# Patient Record
Sex: Female | Born: 1950 | Race: White | Hispanic: No | Marital: Married
Health system: Southern US, Community
[De-identification: ages and names within clinical notes are randomized; demographics above are authoritative.]

## PROBLEM LIST (undated history)

## (undated) HISTORY — PX: BREAST CYST ASPIRATION: SHX578

---

## 2006-10-16 ENCOUNTER — Ambulatory Visit: Payer: Self-pay | Admitting: Family Medicine

## 2007-04-08 ENCOUNTER — Ambulatory Visit: Payer: Self-pay | Admitting: Family Medicine

## 2007-10-20 ENCOUNTER — Ambulatory Visit: Payer: Self-pay | Admitting: Family Medicine

## 2008-10-20 ENCOUNTER — Ambulatory Visit: Payer: Self-pay | Admitting: Family Medicine

## 2009-05-24 ENCOUNTER — Ambulatory Visit: Payer: Self-pay | Admitting: Family Medicine

## 2009-10-28 ENCOUNTER — Ambulatory Visit: Payer: Self-pay | Admitting: Family Medicine

## 2011-05-04 ENCOUNTER — Ambulatory Visit: Payer: Self-pay | Admitting: Family Medicine

## 2012-10-16 ENCOUNTER — Ambulatory Visit: Payer: Self-pay | Admitting: Family Medicine

## 2013-06-19 ENCOUNTER — Ambulatory Visit: Payer: Self-pay | Admitting: Family Medicine

## 2014-06-17 ENCOUNTER — Ambulatory Visit: Admit: 2014-06-17 | Disposition: A | Discharge status: Home / Self Care | Payer: Self-pay

## 2014-06-22 ENCOUNTER — Ambulatory Visit
Admit: 2014-06-22 | Disposition: A | Discharge status: Home / Self Care | Payer: Self-pay | Attending: Family Medicine | Admitting: Family Medicine

## 2014-06-23 ENCOUNTER — Ambulatory Visit: Admit: 2014-06-23 | Disposition: A | Discharge status: Home / Self Care | Payer: Self-pay

## 2016-06-15 ENCOUNTER — Other Ambulatory Visit: Payer: Self-pay | Admitting: Family Medicine

## 2016-06-15 DIAGNOSIS — Z1231 Encounter for screening mammogram for malignant neoplasm of breast: Secondary | ICD-10-CM

## 2016-07-06 IMAGING — MG MM DIGITAL SCREENING BILAT W/ CAD
3 series · 6 of 6 positions shown · non-contrast
Comparison: Previous exam(s).

CLINICAL DATA: Screening.

EXAM:
DIGITAL SCREENING BILATERAL MAMMOGRAM WITH CAD

[R CC · right · 4 of 4 slices shown (1 of 2)]
[im 1/4]
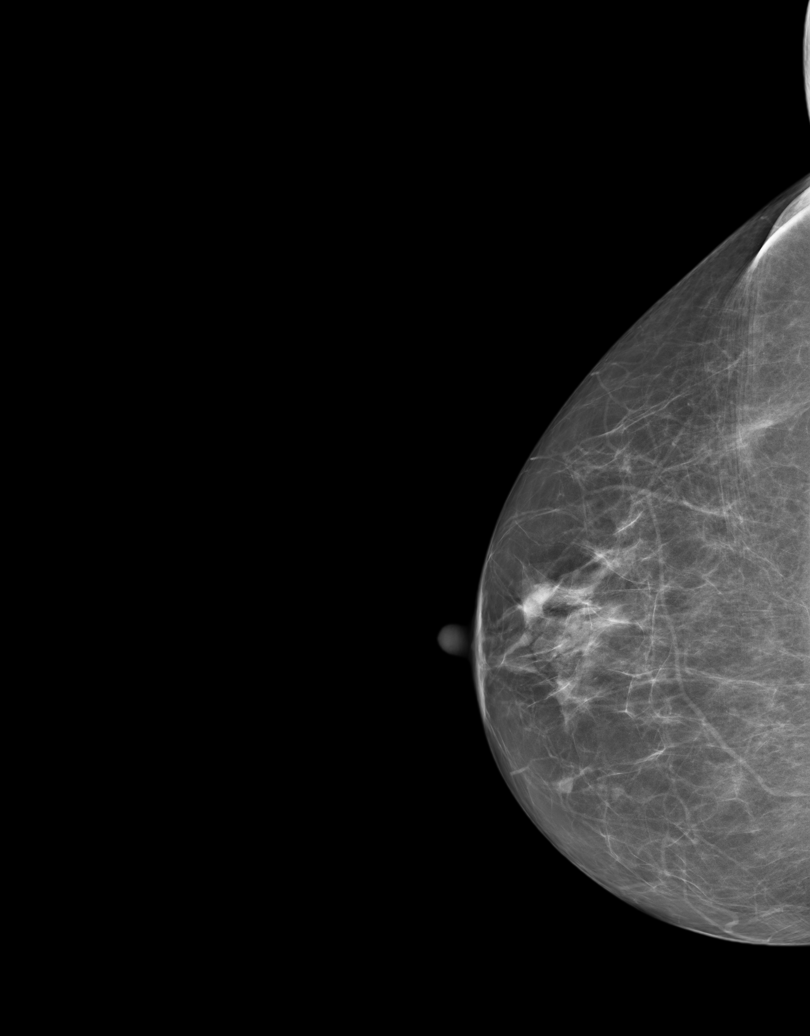
[im 2/4]
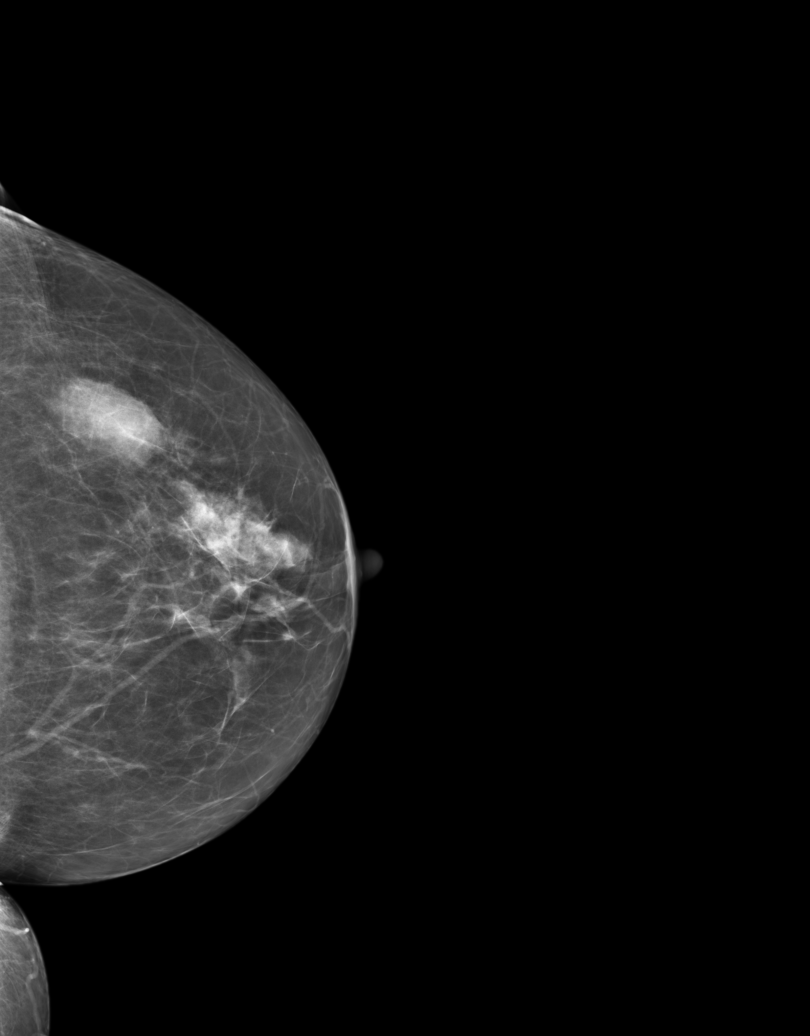
[im 3/4]
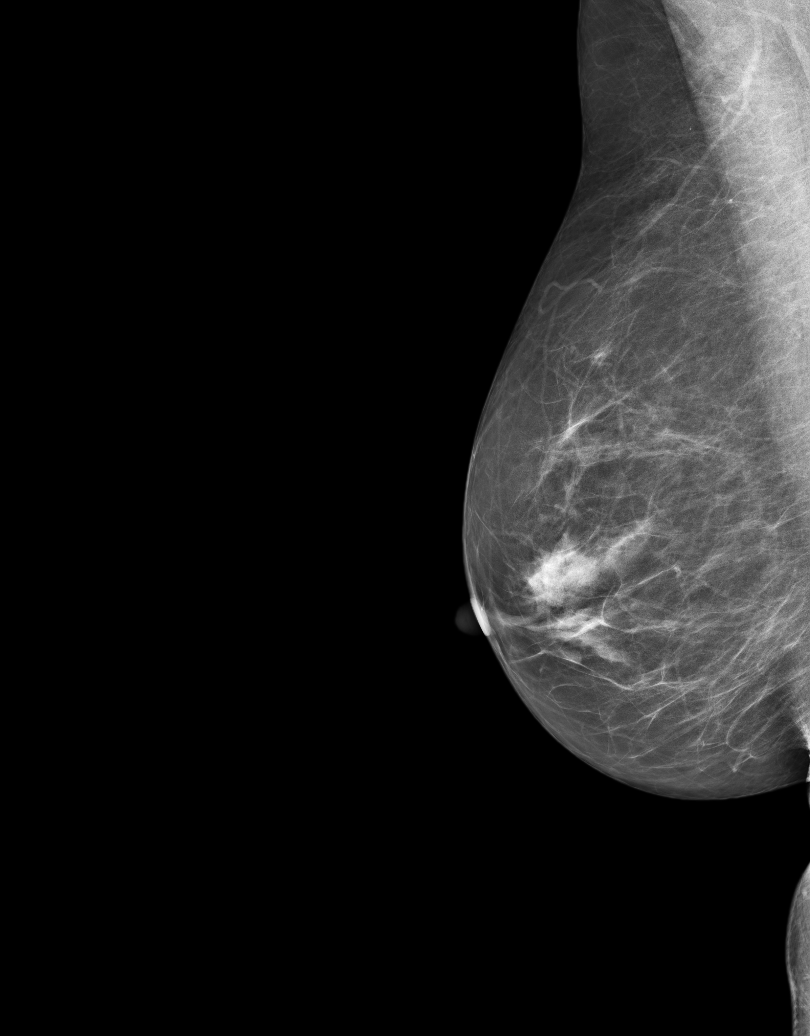
[im 4/4]
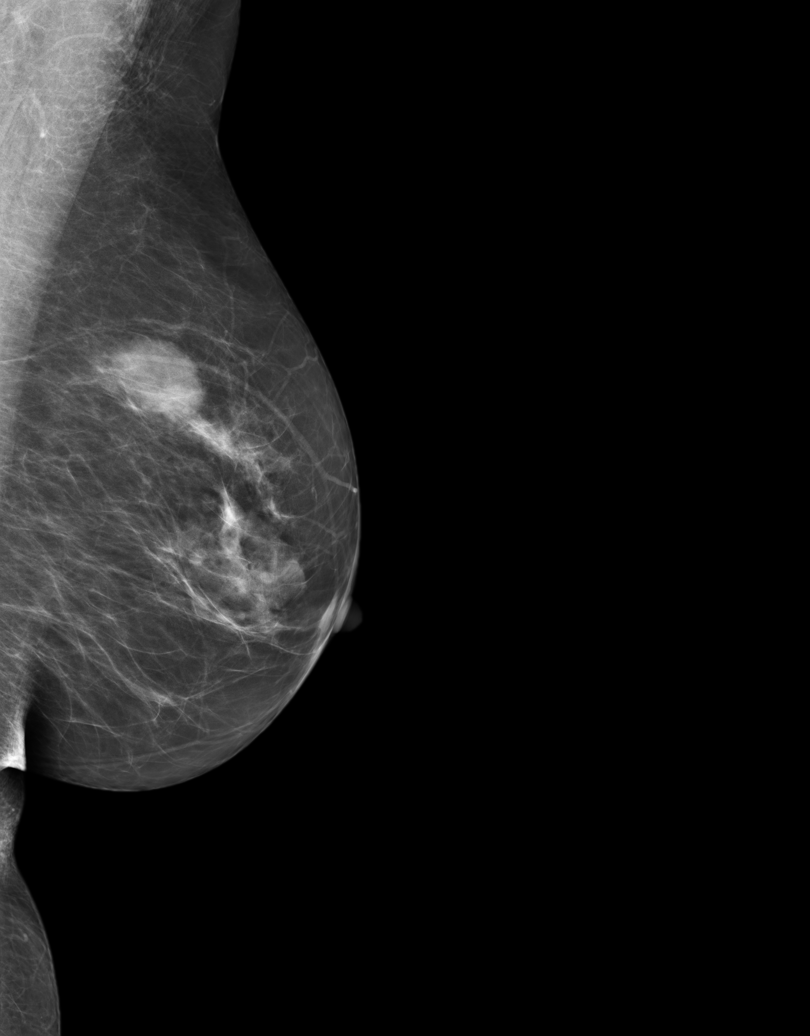

[R CC (2 of 2)]
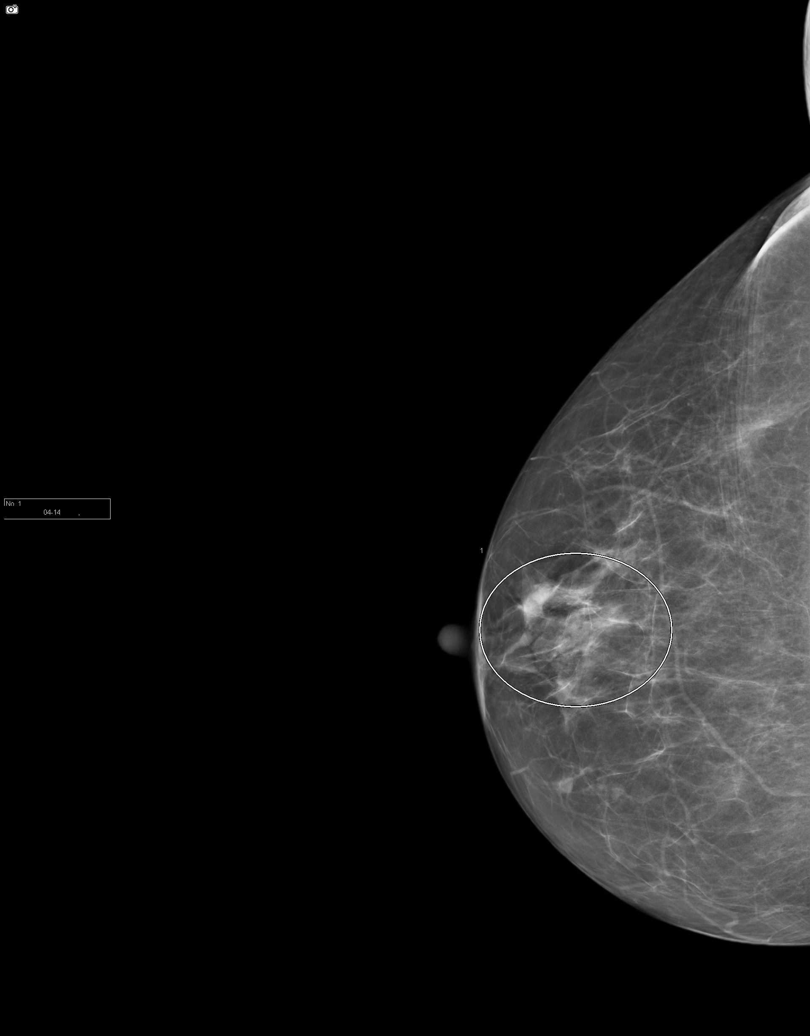

[R MLO]
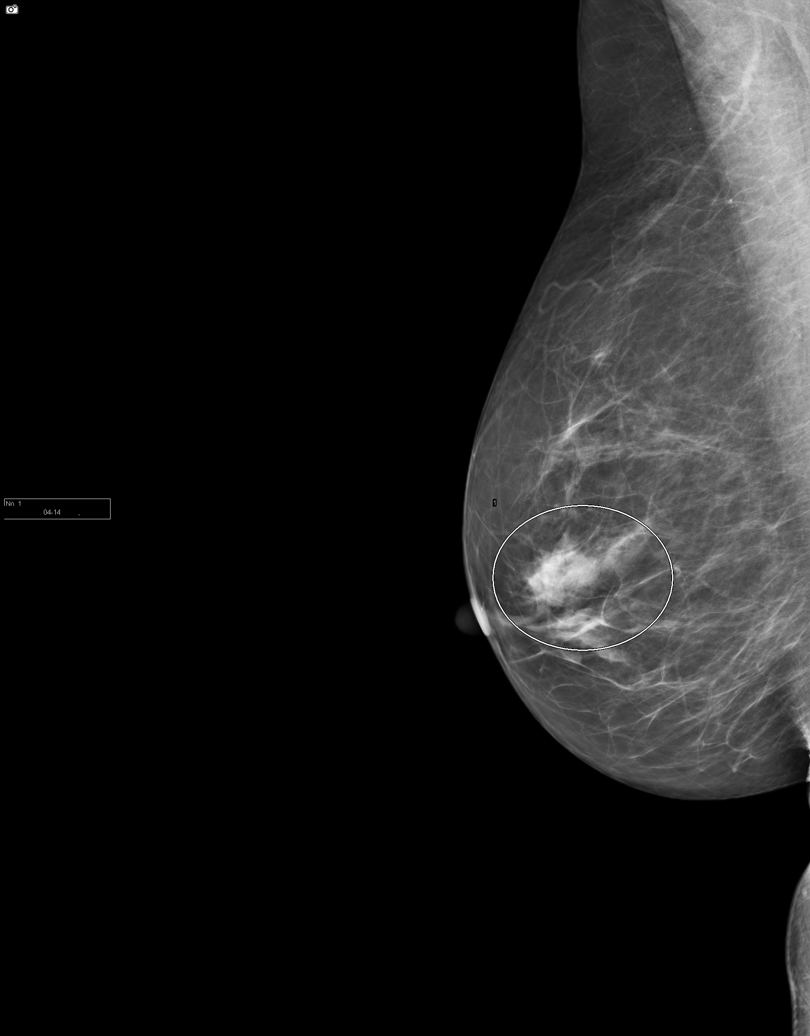

[6 of 6 positions shown; findings below may reference images not displayed]

ACR Breast Density Category b: There are scattered areas of
fibroglandular density.
FINDINGS: In the right breast, a possible asymmetry warrants further
evaluation. In the left breast, no findings suspicious for
malignancy. Images were processed with CAD.
IMPRESSION: Further evaluation is suggested for possible asymmetry in the right
breast.

RECOMMENDATION:
Diagnostic mammogram and possibly ultrasound of the right breast.
(Code:[id number])

The patient will be contacted regarding the findings, and additional
imaging will be scheduled.

BI-RADS CATEGORY  0: Incomplete. Need additional imaging evaluation
and/or prior mammograms for comparison.

## 2016-09-13 ENCOUNTER — Ambulatory Visit
Admission: RE | Admit: 2016-09-13 | Discharge: 2016-09-13 | Disposition: A | Discharge status: Home / Self Care | Payer: BC Managed Care – PPO | Source: Ambulatory Visit | Attending: Family Medicine | Admitting: Family Medicine

## 2016-09-13 DIAGNOSIS — Z1231 Encounter for screening mammogram for malignant neoplasm of breast: Secondary | ICD-10-CM | POA: Diagnosis present

## 2017-10-01 ENCOUNTER — Other Ambulatory Visit: Payer: Self-pay | Admitting: Family Medicine

## 2017-10-01 DIAGNOSIS — Z1231 Encounter for screening mammogram for malignant neoplasm of breast: Secondary | ICD-10-CM

## 2017-10-15 ENCOUNTER — Ambulatory Visit
Admission: RE | Admit: 2017-10-15 | Discharge: 2017-10-15 | Disposition: A | Discharge status: Home / Self Care | Payer: Medicare Other | Source: Ambulatory Visit | Attending: Family Medicine | Admitting: Family Medicine

## 2017-10-15 DIAGNOSIS — Z1231 Encounter for screening mammogram for malignant neoplasm of breast: Secondary | ICD-10-CM

## 2018-05-21 ENCOUNTER — Other Ambulatory Visit: Payer: Self-pay | Admitting: Physician Assistant

## 2018-06-25 ENCOUNTER — Other Ambulatory Visit: Payer: Self-pay | Admitting: Family Medicine

## 2018-06-25 DIAGNOSIS — E2839 Other primary ovarian failure: Secondary | ICD-10-CM

## 2018-06-25 DIAGNOSIS — Z1231 Encounter for screening mammogram for malignant neoplasm of breast: Secondary | ICD-10-CM

## 2019-01-13 ENCOUNTER — Encounter (INDEPENDENT_AMBULATORY_CARE_PROVIDER_SITE_OTHER): Payer: Self-pay

## 2019-01-13 ENCOUNTER — Other Ambulatory Visit: Payer: Self-pay

## 2019-01-13 ENCOUNTER — Ambulatory Visit
Admission: RE | Admit: 2019-01-13 | Discharge: 2019-01-13 | Disposition: A | Discharge status: Home / Self Care | Payer: Medicare Other | Source: Ambulatory Visit | Attending: Family Medicine | Admitting: Family Medicine

## 2019-01-13 DIAGNOSIS — E2839 Other primary ovarian failure: Secondary | ICD-10-CM | POA: Insufficient documentation

## 2019-01-13 DIAGNOSIS — Z1231 Encounter for screening mammogram for malignant neoplasm of breast: Secondary | ICD-10-CM | POA: Insufficient documentation

## 2019-04-10 ENCOUNTER — Ambulatory Visit: Payer: Medicare PPO | Attending: Internal Medicine

## 2019-04-10 DIAGNOSIS — Z23 Encounter for immunization: Secondary | ICD-10-CM

## 2019-04-10 NOTE — Progress Notes (Signed)
   Covid-19 Vaccination Clinic  Name:  Donna Norman    MRN: [id number] DOB: [birth year]  04/10/2019  [name] was observed post Covid-19 immunization for 15 minutes without incidence. She was provided with Vaccine Information Sheet and instruction to access the V-Safe system.   [name] was instructed to call 911 with any severe reactions post vaccine: Marland Kitchen Difficulty breathing  . Swelling of your face and throat  . A fast heartbeat  . A bad rash all over your body  . Dizziness and weakness    Immunizations Administered    Name Date Dose VIS Date Route   Moderna COVID-19 Vaccine 04/10/2019  2:17 PM 0.5 mL 02/03/2019 Intramuscular   Manufacturer: Moderna   Lot: [id number]   NDC: 86168-372-90

## 2019-05-12 ENCOUNTER — Ambulatory Visit: Payer: Medicare PPO | Attending: Internal Medicine

## 2019-05-12 DIAGNOSIS — Z23 Encounter for immunization: Secondary | ICD-10-CM | POA: Insufficient documentation

## 2019-05-12 NOTE — Progress Notes (Signed)
   Covid-19 Vaccination Clinic  Name:  Donna Norman    MRN: [id number] DOB: 08/01/1950  05/12/2019  Donna Norman was observed post Covid-19 immunization for 15 minutes without incident. She was provided with Vaccine Information Sheet and instruction to access the V-Safe system.   Donna Norman was instructed to call 911 with any severe reactions post vaccine: Marland Kitchen Difficulty breathing  . Swelling of face and throat  . A fast heartbeat  . A bad rash all over body  . Dizziness and weakness   Immunizations Administered    Name Date Dose VIS Date Route   Moderna COVID-19 Vaccine 05/12/2019  1:45 PM 0.5 mL 02/03/2019 Intramuscular   Manufacturer: Moderna   Lot: [id number]   NDC: 29937-169-67

## 2021-08-16 ENCOUNTER — Other Ambulatory Visit: Payer: Self-pay | Admitting: Family Medicine

## 2021-08-16 DIAGNOSIS — Z1231 Encounter for screening mammogram for malignant neoplasm of breast: Secondary | ICD-10-CM

## 2021-09-04 ENCOUNTER — Ambulatory Visit
Admission: RE | Admit: 2021-09-04 | Discharge: 2021-09-04 | Disposition: A | Discharge status: Home / Self Care | Payer: Medicare PPO | Source: Ambulatory Visit | Attending: Family Medicine | Admitting: Family Medicine

## 2021-09-04 DIAGNOSIS — Z1231 Encounter for screening mammogram for malignant neoplasm of breast: Secondary | ICD-10-CM | POA: Insufficient documentation

## 2023-11-14 ENCOUNTER — Other Ambulatory Visit: Payer: Self-pay | Admitting: Family Medicine

## 2023-11-14 DIAGNOSIS — Z1231 Encounter for screening mammogram for malignant neoplasm of breast: Secondary | ICD-10-CM

## 2023-12-11 ENCOUNTER — Inpatient Hospital Stay: Admission: RE | Admit: 2023-12-11 | Source: Ambulatory Visit

## 2024-01-28 ENCOUNTER — Ambulatory Visit
Admission: RE | Admit: 2024-01-28 | Discharge: 2024-01-28 | Disposition: A | Discharge status: Home / Self Care | Source: Ambulatory Visit | Attending: Family Medicine | Admitting: Family Medicine

## 2024-01-28 DIAGNOSIS — Z1231 Encounter for screening mammogram for malignant neoplasm of breast: Secondary | ICD-10-CM | POA: Insufficient documentation

## 2024-02-24 ENCOUNTER — Other Ambulatory Visit: Payer: Self-pay | Admitting: Family Medicine

## 2024-02-24 DIAGNOSIS — Z780 Asymptomatic menopausal state: Secondary | ICD-10-CM

## 2024-03-10 ENCOUNTER — Ambulatory Visit
Admission: RE | Admit: 2024-03-10 | Discharge: 2024-03-10 | Disposition: A | Discharge status: Home / Self Care | Source: Ambulatory Visit | Attending: Family Medicine | Admitting: Family Medicine

## 2024-03-10 DIAGNOSIS — Z780 Asymptomatic menopausal state: Secondary | ICD-10-CM | POA: Diagnosis present
